# Patient Record
Sex: Male | Born: 1990 | Race: White | Hispanic: No | Marital: Married
Health system: Midwestern US, Community
[De-identification: ages and names within clinical notes are randomized; demographics above are authoritative.]

---

## 2014-11-17 ENCOUNTER — Inpatient Hospital Stay (HOSPITAL_BASED_OUTPATIENT_CLINIC_OR_DEPARTMENT_OTHER)
Admission: EM | Admit: 2014-11-17 | Discharge: 2014-11-18 | DRG: 343 | Disposition: A | Discharge status: Home / Self Care | Payer: BLUE CROSS/BLUE SHIELD | Attending: Surgery | Admitting: Surgery

## 2014-11-17 ENCOUNTER — Encounter (HOSPITAL_BASED_OUTPATIENT_CLINIC_OR_DEPARTMENT_OTHER): Payer: Self-pay | Admitting: *Deleted

## 2014-11-17 ENCOUNTER — Emergency Department (HOSPITAL_BASED_OUTPATIENT_CLINIC_OR_DEPARTMENT_OTHER): Payer: BLUE CROSS/BLUE SHIELD

## 2014-11-17 DIAGNOSIS — K3580 Unspecified acute appendicitis: Secondary | ICD-10-CM | POA: Diagnosis present

## 2014-11-17 DIAGNOSIS — R1031 Right lower quadrant pain: Secondary | ICD-10-CM | POA: Diagnosis present

## 2014-11-17 DIAGNOSIS — K37 Unspecified appendicitis: Secondary | ICD-10-CM | POA: Diagnosis present

## 2014-11-17 LAB — URINALYSIS, ROUTINE W REFLEX MICROSCOPIC
Bilirubin Urine: NEGATIVE
GLUCOSE, UA: NEGATIVE mg/dL
Hgb urine dipstick: NEGATIVE
KETONES UR: NEGATIVE mg/dL
LEUKOCYTES UA: NEGATIVE
NITRITE: NEGATIVE
PH: 6.5 (ref 5.0–8.0)
Protein, ur: NEGATIVE mg/dL
SPECIFIC GRAVITY, URINE: 1.025 (ref 1.005–1.030)
Urobilinogen, UA: 0.2 mg/dL (ref 0.0–1.0)

## 2014-11-17 LAB — CBC WITH DIFFERENTIAL/PLATELET
BASOS PCT: 0 %
Basophils Absolute: 0 10*3/uL (ref 0.0–0.1)
Eosinophils Absolute: 0.1 10*3/uL (ref 0.0–0.7)
Eosinophils Relative: 2 %
HEMATOCRIT: 41.4 % (ref 39.0–52.0)
HEMOGLOBIN: 13.9 g/dL (ref 13.0–17.0)
LYMPHS ABS: 2.1 10*3/uL (ref 0.7–4.0)
LYMPHS PCT: 30 %
MCH: 28.9 pg (ref 26.0–34.0)
MCHC: 33.6 g/dL (ref 30.0–36.0)
MCV: 86.1 fL (ref 78.0–100.0)
MONO ABS: 0.7 10*3/uL (ref 0.1–1.0)
MONOS PCT: 11 %
NEUTROS ABS: 4 10*3/uL (ref 1.7–7.7)
NEUTROS PCT: 57 %
Platelets: 231 10*3/uL (ref 150–400)
RBC: 4.81 MIL/uL (ref 4.22–5.81)
RDW: 13 % (ref 11.5–15.5)
WBC: 7 10*3/uL (ref 4.0–10.5)

## 2014-11-17 LAB — COMPREHENSIVE METABOLIC PANEL
ALBUMIN: 4.3 g/dL (ref 3.5–5.0)
ALK PHOS: 55 U/L (ref 38–126)
ALT: 17 U/L (ref 17–63)
ANION GAP: 7 (ref 5–15)
AST: 18 U/L (ref 15–41)
BILIRUBIN TOTAL: 0.5 mg/dL (ref 0.3–1.2)
BUN: 21 mg/dL — AB (ref 6–20)
CALCIUM: 9.2 mg/dL (ref 8.9–10.3)
CO2: 26 mmol/L (ref 22–32)
Chloride: 105 mmol/L (ref 101–111)
Creatinine, Ser: 1.07 mg/dL (ref 0.61–1.24)
GFR calc Af Amer: >60 mL/min (ref >60)
GLUCOSE: 93 mg/dL (ref 65–99)
POTASSIUM: 4.2 mmol/L (ref 3.5–5.1)
Sodium: 138 mmol/L (ref 135–145)
TOTAL PROTEIN: 7.3 g/dL (ref 6.5–8.1)

## 2014-11-17 LAB — LIPASE, BLOOD: LIPASE: 26 U/L (ref 11–51)

## 2014-11-17 MED ORDER — METRONIDAZOLE IN NACL 5-0.79 MG/ML-% IV SOLN
500.0000 mg | Freq: Three times a day (TID) | INTRAVENOUS | Status: DC
  Administered 2014-11-18 (×3): 500 mg via INTRAVENOUS
  Filled 2014-11-17 (×3): qty 100

## 2014-11-17 MED ORDER — DEXTROSE 5 % IV SOLN
2.0000 g | INTRAVENOUS | Status: DC
  Administered 2014-11-18: 2 g via INTRAVENOUS
  Filled 2014-11-17: qty 2

## 2014-11-17 MED ORDER — HYDROCODONE-ACETAMINOPHEN 5-325 MG PO TABS
1.0000 | ORAL_TABLET | ORAL | Status: DC | PRN
  Administered 2014-11-18 (×2): 1 via ORAL
  Filled 2014-11-17 (×2): qty 1

## 2014-11-17 MED ORDER — ONDANSETRON HCL 4 MG/2ML IJ SOLN
4.0000 mg | Freq: Once | INTRAMUSCULAR | Status: AC
  Administered 2014-11-17: 4 mg via INTRAVENOUS
  Filled 2014-11-17: qty 2

## 2014-11-17 MED ORDER — SODIUM CHLORIDE 0.9 % IV BOLUS (SEPSIS)
1000.0000 mL | Freq: Once | INTRAVENOUS | Status: AC
  Administered 2014-11-17: 1000 mL via INTRAVENOUS

## 2014-11-17 MED ORDER — HYDROMORPHONE HCL 1 MG/ML IJ SOLN: 0.2500 mg | INTRAMUSCULAR | Status: DC | PRN

## 2014-11-17 MED ORDER — MORPHINE SULFATE (PF) 4 MG/ML IV SOLN
4.0000 mg | INTRAVENOUS | Status: DC | PRN
  Administered 2014-11-17: 4 mg via INTRAVENOUS
  Filled 2014-11-17: qty 1

## 2014-11-17 MED ORDER — IOHEXOL 300 MG/ML  SOLN
100.0000 mL | Freq: Once | INTRAMUSCULAR | Status: AC | PRN
  Administered 2014-11-17: 100 mL via INTRAVENOUS

## 2014-11-17 MED ORDER — ONDANSETRON 4 MG PO TBDP: 4.0000 mg | ORAL_TABLET | Freq: Four times a day (QID) | ORAL | Status: DC | PRN

## 2014-11-17 MED ORDER — MORPHINE SULFATE (PF) 4 MG/ML IV SOLN
4.0000 mg | Freq: Once | INTRAVENOUS | Status: AC
  Administered 2014-11-17: 4 mg via INTRAVENOUS
  Filled 2014-11-17: qty 1

## 2014-11-17 MED ORDER — IOHEXOL 300 MG/ML  SOLN
25.0000 mL | Freq: Once | INTRAMUSCULAR | Status: AC | PRN
  Administered 2014-11-17: 25 mL via ORAL

## 2014-11-17 MED ORDER — ONDANSETRON HCL 4 MG/2ML IJ SOLN: 4.0000 mg | Freq: Four times a day (QID) | INTRAMUSCULAR | Status: DC | PRN

## 2014-11-17 MED ORDER — DEXTROSE-NACL 5-0.9 % IV SOLN
INTRAVENOUS | Status: DC
  Administered 2014-11-18: 01:00:00 via INTRAVENOUS
  Administered 2014-11-18: 100 mL/h via INTRAVENOUS

## 2014-11-17 NOTE — ED Provider Notes (Signed)
Mr. Pelzer is a 24 year old male with presumed appendicitis who was transferred here from [facility] ED earlier today for evaluation at [facility] by general surgeon Dr. Kieth Brightly.   Mr. Golden reports RLQ abdominal pain that has been increasing in severity since yesterday. Pain was initially located in lumbar region but is not settled in RLQ. Pain is associated with nausea and anorexia. Denies fevers, chills or vomiting. Pt reports he is feeling good after receiving morphine. He states his pain is down from an 8 to a 4.  On exam, point tenderness over McBurney's point. No rebound or guarding. Pt appears comfortable and non-toxic. Lungs CTAB. Heart RRR. CT abd/pelv states "Mildly prominent appendix with minimal periappendiceal inflammatory changes. Would be difficult to exclude very early appendicitis". Consulted general surgery. Dr. Kieth Brightly is familiar with patient's story and will be down to admit. For full HPI and exam details, see Monico Blitz, PA-C note from [facility] visit earlier today.    Filed Vitals:   11/17/14 1812 11/17/14 2027 11/17/14 2114 11/17/14 2207  BP: 129/86 134/91 118/74 127/81  Pulse: 67 63 68 62  Temp: 98.3 F (36.8 C) 98.4 F (36.9 C) 97.6 F (36.4 C) 97.7 F (36.5 C)  TempSrc: Oral Oral Oral Oral  Resp: _0 Height: _1  (1.854 m)     Weight: 195 lb (88.451 kg)     SpO2: 99% 98% 96% 96%   Results for orders placed or performed during the hospital encounter of 11/17/14 (from the past 48 hour(s))  Urinalysis, Routine w reflex microscopic (not at [facility])     Status: None   Collection Time: 11/17/14  6:10 PM  Result Value Ref Range   Color, Urine YELLOW YELLOW   APPearance CLEAR CLEAR   Specific Gravity, Urine 1.025 1.005 - 1.030   pH 6.5 5.0 - 8.0   Glucose, UA NEGATIVE NEGATIVE mg/dL   Hgb urine dipstick NEGATIVE NEGATIVE   Bilirubin Urine NEGATIVE NEGATIVE   Ketones, ur NEGATIVE NEGATIVE mg/dL   Protein, ur NEGATIVE NEGATIVE mg/dL   Urobilinogen,  UA 0.2 0.0 - 1.0 mg/dL   Nitrite NEGATIVE NEGATIVE   Leukocytes, UA NEGATIVE NEGATIVE    Comment: MICROSCOPIC NOT DONE ON URINES WITH NEGATIVE PROTEIN, BLOOD, LEUKOCYTES, NITRITE, OR GLUCOSE <1000 mg/dL.  CBC with Differential     Status: None   Collection Time: 11/17/14  6:35 PM  Result Value Ref Range   WBC 7.0 4.0 - 10.5 K/uL   RBC 4.81 4.22 - 5.81 MIL/uL   Hemoglobin 13.9 13.0 - 17.0 g/dL   HCT 41.4 39.0 - 52.0 %   MCV 86.1 78.0 - 100.0 fL   MCH 28.9 26.0 - 34.0 pg   MCHC 33.6 30.0 - 36.0 g/dL   RDW 13.0 11.5 - 15.5 %   Platelets 231 150 - 400 K/uL   Neutrophils Relative % 57 %   Neutro Abs 4.0 1.7 - 7.7 K/uL   Lymphocytes Relative 30 %   Lymphs Abs 2.1 0.7 - 4.0 K/uL   Monocytes Relative 11 %   Monocytes Absolute 0.7 0.1 - 1.0 K/uL   Eosinophils Relative 2 %   Eosinophils Absolute 0.1 0.0 - 0.7 K/uL   Basophils Relative 0 %   Basophils Absolute 0.0 0.0 - 0.1 K/uL  Comprehensive metabolic panel     Status: Abnormal   Collection Time: 11/17/14  6:35 PM  Result Value Ref Range   Sodium 138 135 - 145 mmol/L   Potassium 4.2  3.5 - 5.1 mmol/L   Chloride 105 101 - 111 mmol/L   CO2 26 22 - 32 mmol/L   Glucose, Bld 93 65 - 99 mg/dL   BUN 21 (H) 6 - 20 mg/dL   Creatinine, Ser 1.07 0.61 - 1.24 mg/dL   Calcium 9.2 8.9 - 10.3 mg/dL   Total Protein 7.3 6.5 - 8.1 g/dL   Albumin 4.3 3.5 - 5.0 g/dL   AST 18 15 - 41 U/L   ALT 17 17 - 63 U/L   Alkaline Phosphatase 55 38 - 126 U/L   Total Bilirubin 0.5 0.3 - 1.2 mg/dL   GFR calc non Af Amer >60 >60 mL/min   GFR calc Af Amer >60 >60 mL/min    Comment: (NOTE) The eGFR has been calculated using the CKD EPI equation. This calculation has not been validated in all clinical situations. eGFR's persistently <60 mL/min signify possible Chronic Kidney Disease.    Anion gap 7 5 - 15  Lipase, blood     Status: None   Collection Time: 11/17/14  6:35 PM  Result Value Ref Range   Lipase 26 11 - 51 U/L    Comment: Please note change in  reference range.      Josephina Gip, PA-C 11/18/14 0005  Serita Grit, MD 11/19/14 [phone]

## 2014-11-17 NOTE — ED Notes (Signed)
Right lower quadrant pain since yesterday

## 2014-11-17 NOTE — ED Notes (Signed)
Bed: [id number] Expected date:  Expected time:  Means of arrival:  Comments: Pt from [facility]

## 2014-11-17 NOTE — ED Notes (Signed)
Pt had allergic reaction with hives and itching with [facility] staff.  50 mg of Benadryl IV was given..Marland Kitchen

## 2014-11-17 NOTE — ED Notes (Signed)
Pt transferred to [facility] via [facility]

## 2014-11-17 NOTE — ED Provider Notes (Signed)
CSN: [id number]     Arrival date & time 11/17/14  1807 History   First MD Initiated Contact with Patient 11/17/14 1814     Chief Complaint  Patient presents with  . Abdominal Pain     (Consider location/radiation/quality/duration/timing/severity/associated sxs/prior Treatment) HPI  Blood pressure 129/86, pulse 67, temperature 98.3 F (36.8 C), temperature source Oral, resp. rate 18, height 6\' 1"  (1.854 m), weight 195 lb (88.451 kg), SpO2 99 %.  Brandon Houston is a 24 y.o. male complaining of complaining of worsening right lower quadrant pain onset yesterday. Patient states that initially the pain was in the low back, it's worsened to 8 out of 10 and is associated with nausea and decreased appetite. No vomiting, fever, dysuria, hematuria. On review of systems patient endorses mild constipation. Last oral intake was poor T a chips 3 hours ago.  History reviewed. No pertinent past medical history. History reviewed. No pertinent past surgical history. No family history on file. Social History  Substance Use Topics  . Smoking status: Never Smoker   . Smokeless tobacco: None  . Alcohol Use: No    Review of Systems  10 systems reviewed and found to be negative, except as noted in the HPI.   Allergies  Review of patient's allergies indicates no known allergies.  Home Medications   Prior to Admission medications   Not on File   BP 129/86 mmHg  Pulse 67  Temp(Src) 98.3 F (36.8 C) (Oral)  Resp 18  Ht 6\' 1"  (1.854 m)  Wt 195 lb (88.451 kg)  BMI 25.73 kg/m2  SpO2 99% Physical Exam  Constitutional: He is oriented to person, place, and time. He appears well-developed and well-nourished. No distress.  HENT:  Head: Normocephalic.  Eyes: Conjunctivae and EOM are normal.  Cardiovascular: Normal rate and regular rhythm.   Pulmonary/Chest: Effort normal. No stridor.  Abdominal: Soft. Bowel sounds are normal. He exhibits no distension and no mass. There is tenderness. There is no  rebound and no guarding.  Murphy sign negative,   + TTP over McBurney's point, + Rovsings, +Psoas, + obturator.   Musculoskeletal: Normal range of motion.  Neurological: He is alert and oriented to person, place, and time.  Psychiatric: He has a normal mood and affect.  Nursing note and vitals reviewed.   ED Course  Procedures (including critical care time) Labs Review Labs Reviewed  COMPREHENSIVE METABOLIC PANEL - Abnormal; Notable for the following:    BUN 21 (*)    All other components within normal limits  URINALYSIS, ROUTINE W REFLEX MICROSCOPIC (NOT AT [facility])  CBC WITH DIFFERENTIAL/PLATELET  LIPASE, BLOOD    Imaging Review No results found. I have personally reviewed and evaluated these images and lab results as part of my medical decision-making.   EKG Interpretation None      MDM   Final diagnoses:  RLQ abdominal pain    Filed Vitals:   11/17/14 1812  BP: 129/86  Pulse: 67  Temp: 98.3 F (36.8 C)  TempSrc: Oral  Resp: 18  Height: 6\' 1"  (1.854 m)  Weight: 195 lb (88.451 kg)  SpO2: 99%    Medications  morphine 4 MG/ML injection 4 mg (not administered)  sodium chloride 0.9 % bolus 1,000 mL (not administered)    Brandon Houston is 24 y.o. male presenting with right lower quadrant pain onset this morning, patient is nauseous, reduced by mouth intake. Physical exam with positive tenderness palpation over McBurney's point, positive Rovsing psoas and obturator. No urinary symptoms. Patient  advised to remain nothing by mouth.   Blood work and urine unremarkable.  CT abdomen pelvis equivocal, I think this is likely an early appendicitis. Case discussed with general surgeon Dr. Sheliah Hatch  who will evaluate him at [facility], case discussed with ED physician Dr. Loretha Stapler who accepts transfer.        [name], PA-C 11/17/14 2121  Geoffery Lyons, MD 11/17/14 2244

## 2014-11-17 NOTE — H&P (Signed)
Brandon Houston is an 24 y.o. male.   Chief Complaint: abdominal pain HPI: 24yo male with 2 days of RLQ abdominal pain. Pain first located in right back then moved to RLQ. He has been nauseated all day, pain does not radiate, the patient notes "rebound". He has had no diarrhea and has no history of constipation. He has had no previous similar episodes.  History reviewed. No pertinent past medical history.  History reviewed. No pertinent past surgical history.  No family history on file. Social History:  reports that he has never smoked. He does not have any smokeless tobacco history on file. He reports that he does not drink alcohol or use illicit drugs.  Allergies:  Allergies  Allergen Reactions  . Morphine And Related Anaphylaxis and Hives     (Not in a hospital admission)  Results for orders placed or performed during the hospital encounter of 11/17/14 (from the past 48 hour(s))  Urinalysis, Routine w reflex microscopic (not at [facility])     Status: None   Collection Time: 11/17/14  6:10 PM  Result Value Ref Range   Color, Urine YELLOW YELLOW   APPearance CLEAR CLEAR   Specific Gravity, Urine 1.025 1.005 - 1.030   pH 6.5 5.0 - 8.0   Glucose, UA NEGATIVE NEGATIVE mg/dL   Hgb urine dipstick NEGATIVE NEGATIVE   Bilirubin Urine NEGATIVE NEGATIVE   Ketones, ur NEGATIVE NEGATIVE mg/dL   Protein, ur NEGATIVE NEGATIVE mg/dL   Urobilinogen, UA 0.2 0.0 - 1.0 mg/dL   Nitrite NEGATIVE NEGATIVE   Leukocytes, UA NEGATIVE NEGATIVE    Comment: MICROSCOPIC NOT DONE ON URINES WITH NEGATIVE PROTEIN, BLOOD, LEUKOCYTES, NITRITE, OR GLUCOSE <1000 mg/dL.  CBC with Differential     Status: None   Collection Time: 11/17/14  6:35 PM  Result Value Ref Range   WBC 7.0 4.0 - 10.5 K/uL   RBC 4.81 4.22 - 5.81 MIL/uL   Hemoglobin 13.9 13.0 - 17.0 g/dL   HCT 41.4 39.0 - 52.0 %   MCV 86.1 78.0 - 100.0 fL   MCH 28.9 26.0 - 34.0 pg   MCHC 33.6 30.0 - 36.0 g/dL   RDW 13.0 11.5 - 15.5 %   Platelets 231 150 -  400 K/uL   Neutrophils Relative % 57 %   Neutro Abs 4.0 1.7 - 7.7 K/uL   Lymphocytes Relative 30 %   Lymphs Abs 2.1 0.7 - 4.0 K/uL   Monocytes Relative 11 %   Monocytes Absolute 0.7 0.1 - 1.0 K/uL   Eosinophils Relative 2 %   Eosinophils Absolute 0.1 0.0 - 0.7 K/uL   Basophils Relative 0 %   Basophils Absolute 0.0 0.0 - 0.1 K/uL  Comprehensive metabolic panel     Status: Abnormal   Collection Time: 11/17/14  6:35 PM  Result Value Ref Range   Sodium 138 135 - 145 mmol/L   Potassium 4.2 3.5 - 5.1 mmol/L   Chloride 105 101 - 111 mmol/L   CO2 26 22 - 32 mmol/L   Glucose, Bld 93 65 - 99 mg/dL   BUN 21 (H) 6 - 20 mg/dL   Creatinine, Ser 1.07 0.61 - 1.24 mg/dL   Calcium 9.2 8.9 - 10.3 mg/dL   Total Protein 7.3 6.5 - 8.1 g/dL   Albumin 4.3 3.5 - 5.0 g/dL   AST 18 15 - 41 U/L   ALT 17 17 - 63 U/L   Alkaline Phosphatase 55 38 - 126 U/L   Total Bilirubin 0.5 0.3 - 1.2  mg/dL   GFR calc non Af Amer >60 >60 mL/min   GFR calc Af Amer >60 >60 mL/min    Comment: (NOTE) The eGFR has been calculated using the CKD EPI equation. This calculation has not been validated in all clinical situations. eGFR's persistently <60 mL/min signify possible Chronic Kidney Disease.    Anion gap 7 5 - 15  Lipase, blood     Status: None   Collection Time: 11/17/14  6:35 PM  Result Value Ref Range   Lipase 26 11 - 51 U/L    Comment: Please note change in reference range.   Ct Abdomen Pelvis W Contrast  11/17/2014  CLINICAL DATA:  Right lower quadrant pain for 5 days, initial encounter EXAM: CT ABDOMEN AND PELVIS WITH CONTRAST TECHNIQUE: Multidetector CT imaging of the abdomen and pelvis was performed using the standard protocol following bolus administration of intravenous contrast. CONTRAST:  62m OMNIPAQUE IOHEXOL 300 MG/ML SOLN, 1082mOMNIPAQUE IOHEXOL 300 MG/ML SOLN COMPARISON:  None. FINDINGS: Lung bases are free of acute infiltrate or sizable effusion. No parenchymal nodules are seen. Very minimal basilar  atelectasis is noted. The liver is diffusely decreased in attenuation consistent with fatty infiltration. The gallbladder, spleen, adrenal glands and pancreas are all normal in their CT appearance. Kidneys are well visualized bilaterally without renal calculi or urinary tract obstructive changes. The abdominal aorta and IVC are within normal limits. The bladder is well distended. No pelvic mass lesion or sidewall abnormality is seen. The appendix is well visualized and measures 9.2 mm in greatest dimension. Some very minimal periappendiceal inflammatory changes are noted. A few through it filled loops of small bowel are noted in the right mid abdomen although no true obstructive changes are seen. This may be related some localized enteritis. No bony abnormality is seen. IMPRESSION: Mildly prominent appendix with minimal periappendiceal inflammatory changes. Would be difficult to exclude very early appendicitis although the patient's white blood cell count is normal. Few loops of fluid filled mildly prominent small bowel in the right mid abdomen without true obstructive change. This may represent some mild enteritis. Clinical correlation with physical exam is recommended. Electronically Signed   By: MaInez Catalina.D.   On: 11/17/2014 20:30    Review of Systems  Constitutional: Negative for fever and chills.  Eyes: Negative for blurred vision and double vision.  Respiratory: Negative for cough and hemoptysis.   Cardiovascular: Negative for chest pain and palpitations.  Gastrointestinal: Positive for nausea, vomiting and abdominal pain. Negative for diarrhea and constipation.  Genitourinary: Negative for dysuria and urgency.  Musculoskeletal: Positive for back pain. Negative for myalgias and neck pain.  Skin: Negative for itching and rash.  Neurological: Negative for dizziness, tingling and headaches.  Endo/Heme/Allergies: Negative for environmental allergies. Does not bruise/bleed easily.   Psychiatric/Behavioral: Negative for depression and substance abuse.    Blood pressure 127/81, pulse 62, temperature 97.7 F (36.5 C), temperature source Oral, resp. rate 18, height _0  (1.854 m), weight 88.451 kg (195 lb), SpO2 96 %. Physical Exam  Constitutional: He is oriented to person, place, and time. He appears well-developed and well-nourished.  HENT:  Head: Normocephalic and atraumatic.  Eyes: Conjunctivae are normal. Pupils are equal, round, and reactive to light.  Neck: Normal range of motion. Neck supple.  Cardiovascular: Normal rate and regular rhythm.   Respiratory: Effort normal and breath sounds normal.  GI: Soft. There is tenderness in the right lower quadrant. There is tenderness at McBurney's point.  +iliopsoas sign  Musculoskeletal:  Normal range of motion. He exhibits no edema.  Neurological: He is alert and oriented to person, place, and time.  Skin: Skin is warm and dry.  Psychiatric: He has a normal mood and affect. His behavior is normal.     Assessment/Plan 24 yo male with exam concerning for appendicits. Nml WBC. CT scan with borderline appendix width. -admit for Abx -if no improvement tomorrow plan for appendectomy -NPO -IVF  Arta Bruce Armonii Sieh 11/17/2014, 11:48 PM

## 2014-11-17 NOTE — ED Notes (Signed)
Pt in ct 

## 2014-11-17 NOTE — ED Notes (Signed)
Pt has completed oral contrast. CT notified.

## 2014-11-18 ENCOUNTER — Encounter (HOSPITAL_COMMUNITY): Admission: EM | Disposition: A | Discharge status: Home / Self Care | Payer: Self-pay | Source: Home / Self Care

## 2014-11-18 ENCOUNTER — Inpatient Hospital Stay (HOSPITAL_COMMUNITY): Payer: BLUE CROSS/BLUE SHIELD | Admitting: Certified Registered Nurse Anesthetist

## 2014-11-18 ENCOUNTER — Encounter: Payer: Self-pay | Admitting: General Surgery

## 2014-11-18 ENCOUNTER — Encounter (HOSPITAL_COMMUNITY): Payer: Self-pay | Admitting: Surgery

## 2014-11-18 HISTORY — PX: LAPAROSCOPIC APPENDECTOMY: SHX408

## 2014-11-18 LAB — SURGICAL PCR SCREEN
MRSA, PCR: NEGATIVE
STAPHYLOCOCCUS AUREUS: NEGATIVE

## 2014-11-18 LAB — CBC
HCT: 42.9 % (ref 39.0–52.0)
HEMOGLOBIN: 13.9 g/dL (ref 13.0–17.0)
MCH: 28.9 pg (ref 26.0–34.0)
MCHC: 32.4 g/dL (ref 30.0–36.0)
MCV: 89.2 fL (ref 78.0–100.0)
PLATELETS: 205 10*3/uL (ref 150–400)
RBC: 4.81 MIL/uL (ref 4.22–5.81)
RDW: 13.4 % (ref 11.5–15.5)
WBC: 5.8 10*3/uL (ref 4.0–10.5)

## 2014-11-18 SURGERY — APPENDECTOMY, LAPAROSCOPIC
Anesthesia: General

## 2014-11-18 MED ORDER — FENTANYL CITRATE (PF) 100 MCG/2ML IJ SOLN: 25.0000 ug | INTRAMUSCULAR | Status: DC | PRN

## 2014-11-18 MED ORDER — METOCLOPRAMIDE HCL 5 MG/ML IJ SOLN
INTRAMUSCULAR | Status: AC
  Filled 2014-11-18: qty 2

## 2014-11-18 MED ORDER — PROPOFOL 10 MG/ML IV BOLUS
INTRAVENOUS | Status: AC
  Filled 2014-11-18: qty 20

## 2014-11-18 MED ORDER — NEOSTIGMINE METHYLSULFATE 10 MG/10ML IV SOLN
INTRAVENOUS | Status: DC | PRN
  Administered 2014-11-18: 5 mg via INTRAVENOUS

## 2014-11-18 MED ORDER — PROMETHAZINE HCL 25 MG/ML IJ SOLN
6.2500 mg | INTRAMUSCULAR | Status: DC | PRN
  Administered 2014-11-18: 6.25 mg via INTRAVENOUS

## 2014-11-18 MED ORDER — BUPIVACAINE-EPINEPHRINE 0.5% -1:200000 IJ SOLN
INTRAMUSCULAR | Status: DC | PRN
  Administered 2014-11-18: 30 mL

## 2014-11-18 MED ORDER — LIDOCAINE HCL (CARDIAC) 20 MG/ML IV SOLN
INTRAVENOUS | Status: AC
  Filled 2014-11-18: qty 5

## 2014-11-18 MED ORDER — ONDANSETRON HCL 4 MG/2ML IJ SOLN
INTRAMUSCULAR | Status: DC | PRN
  Administered 2014-11-18: 4 mg via INTRAVENOUS

## 2014-11-18 MED ORDER — HYDROMORPHONE HCL 1 MG/ML IJ SOLN: 0.2500 mg | INTRAMUSCULAR | Status: DC | PRN

## 2014-11-18 MED ORDER — FENTANYL CITRATE (PF) 250 MCG/5ML IJ SOLN
INTRAMUSCULAR | Status: AC
  Filled 2014-11-18: qty 25

## 2014-11-18 MED ORDER — HYDROCODONE-ACETAMINOPHEN 5-325 MG PO TABS: 1.0000 | ORAL_TABLET | ORAL | Status: AC | PRN

## 2014-11-18 MED ORDER — DEXAMETHASONE SODIUM PHOSPHATE 10 MG/ML IJ SOLN
INTRAMUSCULAR | Status: AC
  Filled 2014-11-18: qty 1

## 2014-11-18 MED ORDER — SUCCINYLCHOLINE CHLORIDE 20 MG/ML IJ SOLN
INTRAMUSCULAR | Status: DC | PRN
  Administered 2014-11-18: 100 mg via INTRAVENOUS

## 2014-11-18 MED ORDER — FENTANYL CITRATE (PF) 100 MCG/2ML IJ SOLN
INTRAMUSCULAR | Status: DC | PRN
  Administered 2014-11-18 (×5): 50 ug via INTRAVENOUS

## 2014-11-18 MED ORDER — KETOROLAC TROMETHAMINE 30 MG/ML IJ SOLN: 30.0000 mg | Freq: Four times a day (QID) | INTRAMUSCULAR | Status: DC | PRN

## 2014-11-18 MED ORDER — DEXAMETHASONE SODIUM PHOSPHATE 10 MG/ML IJ SOLN
INTRAMUSCULAR | Status: DC | PRN
  Administered 2014-11-18: 10 mg via INTRAVENOUS

## 2014-11-18 MED ORDER — PROPOFOL 10 MG/ML IV BOLUS
INTRAVENOUS | Status: DC | PRN
  Administered 2014-11-18: 170 mg via INTRAVENOUS

## 2014-11-18 MED ORDER — GLYCOPYRROLATE 0.2 MG/ML IJ SOLN
INTRAMUSCULAR | Status: DC | PRN
  Administered 2014-11-18: 0.6 mg via INTRAVENOUS

## 2014-11-18 MED ORDER — GLYCOPYRROLATE 0.2 MG/ML IJ SOLN
INTRAMUSCULAR | Status: AC
  Filled 2014-11-18: qty 3

## 2014-11-18 MED ORDER — BUPIVACAINE-EPINEPHRINE (PF) 0.25% -1:200000 IJ SOLN
INTRAMUSCULAR | Status: AC
  Filled 2014-11-18: qty 30

## 2014-11-18 MED ORDER — METOCLOPRAMIDE HCL 5 MG/ML IJ SOLN
INTRAMUSCULAR | Status: DC | PRN
  Administered 2014-11-18: 10 mg via INTRAVENOUS

## 2014-11-18 MED ORDER — IBUPROFEN 200 MG PO TABS: ORAL_TABLET | ORAL | Status: AC

## 2014-11-18 MED ORDER — MEPERIDINE HCL 50 MG/ML IJ SOLN: 6.2500 mg | INTRAMUSCULAR | Status: DC | PRN

## 2014-11-18 MED ORDER — MIDAZOLAM HCL 5 MG/5ML IJ SOLN
INTRAMUSCULAR | Status: DC | PRN
  Administered 2014-11-18: 2 mg via INTRAVENOUS

## 2014-11-18 MED ORDER — MIDAZOLAM HCL 2 MG/2ML IJ SOLN
INTRAMUSCULAR | Status: AC
  Filled 2014-11-18: qty 4

## 2014-11-18 MED ORDER — ROCURONIUM BROMIDE 100 MG/10ML IV SOLN
INTRAVENOUS | Status: AC
  Filled 2014-11-18: qty 1

## 2014-11-18 MED ORDER — METRONIDAZOLE IN NACL 5-0.79 MG/ML-% IV SOLN
INTRAVENOUS | Status: AC
  Filled 2014-11-18: qty 100

## 2014-11-18 MED ORDER — ONDANSETRON HCL 4 MG/2ML IJ SOLN
INTRAMUSCULAR | Status: AC
  Filled 2014-11-18: qty 2

## 2014-11-18 MED ORDER — GLYCOPYRROLATE 0.2 MG/ML IJ SOLN
INTRAMUSCULAR | Status: AC
  Filled 2014-11-18: qty 1

## 2014-11-18 MED ORDER — LIDOCAINE HCL (CARDIAC) 20 MG/ML IV SOLN
INTRAVENOUS | Status: DC | PRN
  Administered 2014-11-18: 100 mg via INTRAVENOUS

## 2014-11-18 MED ORDER — PROMETHAZINE HCL 25 MG/ML IJ SOLN
INTRAMUSCULAR | Status: AC
  Filled 2014-11-18: qty 1

## 2014-11-18 MED ORDER — NEOSTIGMINE METHYLSULFATE 10 MG/10ML IV SOLN
INTRAVENOUS | Status: AC
  Filled 2014-11-18: qty 1

## 2014-11-18 MED ORDER — ROCURONIUM BROMIDE 100 MG/10ML IV SOLN
INTRAVENOUS | Status: DC | PRN
  Administered 2014-11-18: 30 mg via INTRAVENOUS

## 2014-11-18 MED ORDER — BUPIVACAINE-EPINEPHRINE 0.5% -1:200000 IJ SOLN
INTRAMUSCULAR | Status: AC
  Filled 2014-11-18: qty 1

## 2014-11-18 MED ORDER — FENTANYL CITRATE (PF) 100 MCG/2ML IJ SOLN
INTRAMUSCULAR | Status: AC
  Administered 2014-11-18: 50 ug
  Filled 2014-11-18: qty 2

## 2014-11-18 MED ORDER — LACTATED RINGERS IV SOLN
INTRAVENOUS | Status: DC | PRN
  Administered 2014-11-18: 08:00:00 via INTRAVENOUS

## 2014-11-18 SURGICAL SUPPLY — 35 items
APPLIER CLIP ROT 10 11.4 M/L (STAPLE)
BENZOIN TINCTURE PRP APPL 2/3 (GAUZE/BANDAGES/DRESSINGS) IMPLANT
CABLE HIGH FREQUENCY MONO STRZ (ELECTRODE) ×2 IMPLANT
CHLORAPREP W/TINT 26ML (MISCELLANEOUS) ×2 IMPLANT
CLIP APPLIE ROT 10 11.4 M/L (STAPLE) IMPLANT
COVER SURGICAL LIGHT HANDLE (MISCELLANEOUS) ×2 IMPLANT
CUTTER FLEX LINEAR 45M (STAPLE) ×2 IMPLANT
DECANTER SPIKE VIAL GLASS SM (MISCELLANEOUS) ×2 IMPLANT
DRAPE LAPAROSCOPIC ABDOMINAL (DRAPES) ×2 IMPLANT
ELECT REM PT RETURN 9FT ADLT (ELECTROSURGICAL) ×2
ELECTRODE REM PT RTRN 9FT ADLT (ELECTROSURGICAL) ×1 IMPLANT
ENDOLOOP SUT PDS II  0 18 (SUTURE)
ENDOLOOP SUT PDS II 0 18 (SUTURE) IMPLANT
GLOVE SURG SIGNA 7.5 PF LTX (GLOVE) ×2 IMPLANT
GOWN STRL REUS W/TWL XL LVL3 (GOWN DISPOSABLE) ×4 IMPLANT
KIT BASIN OR (CUSTOM PROCEDURE TRAY) ×2 IMPLANT
LIQUID BAND (GAUZE/BANDAGES/DRESSINGS) ×2 IMPLANT
POUCH SPECIMEN RETRIEVAL 10MM (ENDOMECHANICALS) ×2 IMPLANT
RELOAD 45 VASCULAR/THIN (ENDOMECHANICALS) IMPLANT
RELOAD STAPLE TA45 3.5 REG BLU (ENDOMECHANICALS) ×2 IMPLANT
SCISSORS LAP 5X35 DISP (ENDOMECHANICALS) ×4 IMPLANT
SET IRRIG TUBING LAPAROSCOPIC (IRRIGATION / IRRIGATOR) ×2 IMPLANT
SHEARS HARMONIC ACE PLUS 36CM (ENDOMECHANICALS) ×2 IMPLANT
SLEEVE XCEL OPT CAN 5 100 (ENDOMECHANICALS) ×2 IMPLANT
STRIP CLOSURE SKIN 1/2X4 (GAUZE/BANDAGES/DRESSINGS) IMPLANT
SUT MNCRL AB 4-0 PS2 18 (SUTURE) ×2 IMPLANT
SUT VIC AB 2-0 SH 18 (SUTURE) IMPLANT
SUT VIC AB 2-0 SH 27 (SUTURE) ×1
SUT VIC AB 2-0 SH 27X BRD (SUTURE) ×1 IMPLANT
TOWEL OR 17X26 10 PK STRL BLUE (TOWEL DISPOSABLE) ×2 IMPLANT
TOWEL OR NON WOVEN STRL DISP B (DISPOSABLE) ×2 IMPLANT
TRAY FOLEY W/METER SILVER 14FR (SET/KITS/TRAYS/PACK) ×2 IMPLANT
TRAY LAPAROSCOPIC (CUSTOM PROCEDURE TRAY) ×2 IMPLANT
TROCAR BLADELESS OPT 5 100 (ENDOMECHANICALS) ×2 IMPLANT
TROCAR XCEL BLUNT TIP 100MML (ENDOMECHANICALS) ×2 IMPLANT

## 2014-11-18 NOTE — Progress Notes (Signed)
Patient's history and PE reviewed. Though the story is not straight forward, I think that he has appendicitis.  I discussed with the patient the indications and risks of appendiceal surgery.  The primary risks of appendiceal surgery include, but are not limited to, bleeding, infection, bowel surgery, and open surgery.  There is also the risk that the patient may have continued symptoms after surgery.  We discussed the typical post-operative recovery course. I tried to answer the patient's questions.  His wife, Leandro ReasonerRoxanne, is at the bedside.  He works at [employer].  Ovidio Kinavid Teonna Coonan, MD, [facility] Pager: [phone] Office phone:  [phone]

## 2014-11-18 NOTE — Op Note (Signed)
Re:   Brandon Houston DOB:   1990/02/13 MRN:   [id number]                   FACILITY:  [facility]  DATE OF PROCEDURE: 11/18/2014                              OPERATIVE REPORT  PREOPERATIVE DIAGNOSIS:  Appendicitis  POSTOPERATIVE DIAGNOSIS:  Early appendicitis.  PROCEDURE:  Laparoscopic appendectomy.  SURGEON:  Sandria Bales. Ezzard Standing, MD  ASSISTANT:  No first assistant.  ANESTHESIA:  General endotracheal.  Anesthesiologist: Sebastian Ache, MD CRNA: Orest Dikes, CRNA  ASA:  1E  ESTIMATED BLOOD LOSS:  Minimal.  DRAINS: none   SPECIMEN:   Appendix  COUNTS CORRECT:  YES  INDICATIONS FOR PROCEDURE: Brandon Houston is a 24 y.o. (DOB: 1990-12-20) white  male whose primary care doctor is [facility], MD and comes to the OR for an appendectomy.    His presentation is a little unusual - but he has an abnormal CT scan and persistent symptoms - so will go ahead with appendectomy.  I discussed with the patient, the indications and potential complications of appendiceal surgery.  The potential complications include, but are not limited to, bleeding, open surgery, bowel resection, and the possibility of another diagnosis.  OPERATIVE NOTE:  The patient underwent a general endotracheal anesthetic as supervised by Anesthesiologist: Sebastian Ache, MD CRNA: Orest Dikes, CRNA, General, in room #6.  The patient was on Rocephin and Flagyl prior to the beginning of the procedure and the abdomen was prepped with ChloraPrep.    A time-out was held and surgical checklist run.  An infraumbilical incision was made with sharp dissection carried down to the abdominal cavity.  An 12 mm Hasson trocar was inserted through the infraumbilical incision and into the peritoneal cavity.  A 30 degree 5 mm laparoscope was inserted through a 12 mm Hasson trocar and the Hasson trocar secured with a 0 Vicryl suture.  I placed a 5 mm trocar in the right upper quadrant and 5 mm torcar in left lower quadrant and did abdominal  exploration.    The right and left lobes of liver unremarkable.  Gall bladder was unremarkable.  Stomach was unremarkable.  The pelvic organs were unremarkable.  I saw no other intra-abdominal abnormality.  The patient had a turgid appendix consistent with early appendicitis with the appendix located right pelvic brim.  The mesentery of the appendix was divided with a Harmonic scalpel.  I got to the base of the appendix.  I then used a blue load 45 mm Ethicon Endo-GIA stapler and fired this across the base of the appendix.  I placed the appendix in EndoCatch bag and delivered the bag through the umbilical incision.  I irrigated the abdomen with 600 cc of saline.  There was bleeder at the lateral edge of the staple line and I did a  2-0 Vicryl suture to control this.  After irrigating the abdomen, I then removed the trocars, in turn.  The umbilical port fascia was closed with 0 Vicryl suture.   I closed the skin each site with a 4-0 Monocryl suture and painted the wounds with Dermabond.  I then injected a total of 30 mL of 0.25% Marcaine at the incisions.  Sponge and needle count were correct at the end of the case.   The patient was transferred to the recovery room in good condition.  The  patient tolerated the procedure well and it depends on the patient's post op clinical course as to when the patient could be discharged.   Brandon Kinavid Alonso Gapinski, MD, [facility] Pager: [phone] Office phone:  [phone]

## 2014-11-18 NOTE — Anesthesia Procedure Notes (Signed)
Procedure Name: Intubation Date/Time: 11/18/2014 8:10 AM Performed by: Orest DikesPETERS, Antaeus Karel J Pre-anesthesia Checklist: Patient identified, Emergency Drugs available, Suction available and Patient being monitored Patient Re-evaluated:Patient Re-evaluated prior to inductionOxygen Delivery Method: Circle System Utilized Preoxygenation: Pre-oxygenation with 100% oxygen Intubation Type: IV induction, Rapid sequence and Cricoid Pressure applied Laryngoscope Size: Mac and 4 Grade View: Grade I Tube type: Oral Tube size: 7.5 mm Number of attempts: 1 Airway Equipment and Method: Stylet Placement Confirmation: ETT inserted through vocal cords under direct vision,  positive ETCO2 and breath sounds checked- equal and bilateral Secured at: 21 cm Tube secured with: Tape Dental Injury: Teeth and Oropharynx as per pre-operative assessment

## 2014-11-18 NOTE — Discharge Instructions (Signed)
Laparoscopic Appendectomy, Adult, Care After °Refer to this sheet in the next few weeks. These instructions provide you with information on caring for yourself after your procedure. Your caregiver may also give you more specific instructions. Your treatment has been planned according to current medical practices, but problems sometimes occur. Call your caregiver if you have any problems or questions after your procedure. °HOME CARE INSTRUCTIONS °· Do not drive while taking narcotic pain medicines. °· Use stool softener if you become constipated from your pain medicines. °· Change your bandages (dressings) as directed. °· Keep your wounds clean and dry. You may wash the wounds gently with soap and water. Gently pat the wounds dry with a clean towel. °· Do not take baths, swim, or use hot tubs for 10 days, or as instructed by your caregiver. °· Only take over-the-counter or prescription medicines for pain, discomfort, or fever as directed by your caregiver. °· You may continue your normal diet as directed. °· Do not lift more than 10 pounds (4.5 kg) or play contact sports for 3 weeks, or as directed. °· Slowly increase your activity after surgery. °· Take deep breaths to avoid getting a lung infection (pneumonia). °SEEK MEDICAL CARE IF: °· You have redness, swelling, or increasing pain in your wounds. °· You have pus coming from your wounds. °· You have drainage from a wound that lasts longer than 1 day. °· You notice a bad smell coming from the wounds or dressing. °· Your wound edges break open after stitches (sutures) have been removed. °· You notice increasing pain in the shoulders (shoulder strap areas) or near your shoulder blades. °· You develop dizzy episodes or fainting while standing. °· You develop shortness of breath. °· You develop persistent nausea or vomiting. °· You cannot control your bowel functions or lose your appetite. °· You develop diarrhea. °SEEK IMMEDIATE MEDICAL CARE IF:  °· You have a  fever. °· You develop a rash. °· You have difficulty breathing or sharp pains in your chest. °· You develop any reaction or side effects to medicines given. °MAKE SURE YOU: °· Understand these instructions. °· Will watch your condition. °· Will get help right away if you are not doing well or get worse. °  °This information is not intended to replace advice given to you by your health care provider. Make sure you discuss any questions you have with your health care provider. °  °Document Released: 01/14/2005 Document Revised: 05/31/2014 Document Reviewed: 07/04/2014 °Elsevier Interactive Patient Education ©2016 [employer] ° °CCS ______[facility] °LAPAROSCOPIC SURGERY: POST OP INSTRUCTIONS °Always review your discharge instruction sheet given to you by the facility where your surgery was performed. °IF YOU HAVE DISABILITY OR FAMILY LEAVE FORMS, YOU MUST BRING THEM TO THE OFFICE FOR PROCESSING.   °DO NOT GIVE THEM TO YOUR DOCTOR. ° °1. A prescription for pain medication may be given to you upon discharge.  Take your pain medication as prescribed, if needed.  If narcotic pain medicine is not needed, then you may take acetaminophen (Tylenol) or ibuprofen (Advil) as needed. °2. Take your usually prescribed medications unless otherwise directed. °3. If you need a refill on your pain medication, please contact your pharmacy.  They will contact our office to request authorization. Prescriptions will not be filled after 5pm or on week-ends. °4. You should follow a light diet the first few days after arrival home, such as soup and crackers, etc.  Be sure to include lots of fluids daily. °5. Most   patients will experience some swelling and bruising in the area of the incisions.  Ice packs will help.  Swelling and bruising can take several days to resolve.  °6. It is common to experience some constipation if taking pain medication after surgery.  Increasing fluid intake and taking a stool softener (such as  Colace) will usually help or prevent this problem from occurring.  A mild laxative (Milk of Magnesia or Miralax) should be taken according to package instructions if there are no bowel movements after 48 hours. °7. Unless discharge instructions indicate otherwise, you may remove your bandages 24-48 hours after surgery, and you may shower at that time.  You may have steri-strips (small skin tapes) in place directly over the incision.  These strips should be left on the skin for 7-10 days.  If your surgeon used skin glue on the incision, you may shower in 24 hours.  The glue will flake off over the next 2-3 weeks.  Any sutures or staples will be removed at the office during your follow-up visit. °8. ACTIVITIES:  You may resume regular (light) daily activities beginning the next day--such as daily self-care, walking, climbing stairs--gradually increasing activities as tolerated.  You may have sexual intercourse when it is comfortable.  Refrain from any heavy lifting or straining until approved by your doctor. °a. You may drive when you are no longer taking prescription pain medication, you can comfortably wear a seatbelt, and you can safely maneuver your car and apply brakes. °b. RETURN TO WORK:  __________________________________________________________ °9. You should see your doctor in the office for a follow-up appointment approximately 2-3 weeks after your surgery.  Make sure that you call for this appointment within a day or two after you arrive home to insure a convenient appointment time. °10. OTHER INSTRUCTIONS: __________________________________________________________________________________________________________________________ __________________________________________________________________________________________________________________________ °WHEN TO CALL YOUR DOCTOR: °1. Fever over 101.0 °2. Inability to urinate °3. Continued bleeding from incision. °4. Increased pain, redness, or drainage from the  incision. °5. Increasing abdominal pain ° °The clinic staff is available to answer your questions during regular business hours.  Please don’t hesitate to call and ask to speak to one of the nurses for clinical concerns.  If you have a medical emergency, go to the nearest emergency room or call 911.  A surgeon from [facility] is always on call at the hospital. °[address], [address], [place], [place]  [address] ? [address] [address], [place], [place]   [address] °[phone] ? [phone] ? FAX [phone] °Web site: [email or web address] ° °

## 2014-11-18 NOTE — Discharge Summary (Signed)
Physician Discharge Summary  Patient ID: Brandon Houston MRN: [id number] DOB/AGE: 09-19-1990 24 y.o.  Admit date: 11/17/2014 Discharge date: 11/18/2014  Admission Diagnoses: Acute appendicitis  Discharge Diagnoses:  Same  Active Problems:   Appendicitis   PROCEDURES: Laparoscopic appendectomy, 11/18/14, Dr. Marty HeckNewman  Hospital Course:  24yo male with 2 days of RLQ abdominal pain. Pain first located in right back then moved to RLQ. He has been nauseated all day, pain does not radiate, the patient notes "rebound". He has had no diarrhea and has no history of constipation. He has had no previous similar episodes.  He was seen and admitted by Dr. Sheliah HatchKinsinger.    He was still tender the following AM and was taken to the OR by Dr. Ezzard StandingNewman.  He did well, tolerating diet and PO's by the PM and discharged home.    Condition on D/C:  Improved   Disposition: Home     Medication List    TAKE these medications        HYDROcodone-acetaminophen 5-325 MG tablet  Commonly known as:  NORCO/VICODIN  Take 1-2 tablets by mouth every 4 (four) hours as needed for moderate pain.     ibuprofen 200 MG tablet  Commonly known as:  MOTRIN IB  You can take 2-3 tablets every 6 hours, as needed for pain.  Use this first then the narcotic pain medicine as second choice.           Follow-up Information    Follow up with [facility] On 11/30/2014.   Specialty:  General Surgery   Why:  Your appointment is at 11:30 AM, be at the office 30 minutes early for check in.   Contact information:   [address] [place] [id number] [phone]       Signed: Sherrie GeorgeJENNINGS,WILLARD 11/18/2014, 4:57 PM   Agree with above.  Ovidio Kinavid Mikisha Roseland, MD, [facility] Pager: [phone] Office phone:  [phone]

## 2014-11-18 NOTE — Progress Notes (Signed)
Pt able to tolerate regular diet., void ambulate and pain controlled with oral medicine.  Discharge instructions discussed with patient and wife until all questions answered.

## 2014-11-18 NOTE — Anesthesia Preprocedure Evaluation (Addendum)
Anesthesia Evaluation  Patient identified by MRN, date of birth, ID band Patient awake    Reviewed: Allergy & Precautions, NPO status , Patient's Chart, lab work & pertinent test results, reviewed documented beta blocker date and time   Airway Mallampati: II   Neck ROM: Full    Dental  (+) Dental Advisory Given, Teeth Intact   Pulmonary neg pulmonary ROS,    breath sounds clear to auscultation       Cardiovascular negative cardio ROS   Rhythm:Regular     Neuro/Psych negative neurological ROS     GI/Hepatic negative GI ROS, Neg liver ROS,   Endo/Other  negative endocrine ROS  Renal/GU Renal diseasenegative Renal ROS  negative genitourinary   Musculoskeletal negative musculoskeletal ROS (+)   Abdominal (+)  Abdomen: soft.    Peds  Hematology negative hematology ROS (+)   Anesthesia Other Findings   Reproductive/Obstetrics                            Anesthesia Physical Anesthesia Plan  ASA: I and emergent  Anesthesia Plan: General   Post-op Pain Management:    Induction:   Airway Management Planned: Oral ETT  Additional Equipment:   Intra-op Plan:   Post-operative Plan: Extubation in OR  Informed Consent: I have reviewed the patients History and Physical, chart, labs and discussed the procedure including the risks, benefits and alternatives for the proposed anesthesia with the patient or authorized representative who has indicated his/her understanding and acceptance.     Plan Discussed with:   Anesthesia Plan Comments:         Anesthesia Quick Evaluation

## 2014-11-18 NOTE — Transfer of Care (Signed)
Immediate Anesthesia Transfer of Care Note  Patient: Brandon HaradaJosiah Houston  Procedure(s) Performed: Procedure(s): APPENDECTOMY LAPAROSCOPIC (N/A)  Patient Location: PACU  Anesthesia Type:General  Level of Consciousness:  sedated, patient cooperative and responds to stimulation  Airway & Oxygen Therapy:Patient Spontanous Breathing and Patient connected to face mask oxgen  Post-op Assessment:  Report given to PACU RN and Post -op Vital signs reviewed and stable  Post vital signs:  Reviewed and stable  Last Vitals:  Filed Vitals:   11/18/14 0904  BP: 140/82  Pulse:   Temp:   Resp:     Complications: No apparent anesthesia complications

## 2014-11-18 NOTE — Anesthesia Postprocedure Evaluation (Signed)
  Anesthesia Post-op Note  Patient: Brandon Houston  Procedure(s) Performed: Procedure(s): APPENDECTOMY LAPAROSCOPIC (N/A)  Patient Location: PACU  Anesthesia Type:General  Level of Consciousness: awake, alert  and oriented  Airway and Oxygen Therapy: Patient Spontanous Breathing and Patient connected to nasal cannula oxygen  Post-op Pain: mild  Post-op Assessment: Post-op Vital signs reviewed, Patient's Cardiovascular Status Stable, Respiratory Function Stable, RESPIRATORY FUNCTION UNSTABLE and NAUSEA AND VOMITING PRESENT              Post-op Vital Signs: Reviewed and stable  Last Vitals:  Filed Vitals:   11/18/14 0904  BP: 140/82  Pulse: 60  Temp: 36.5 C  Resp: 19    Complications: No apparent anesthesia complications

## 2016-03-06 IMAGING — CT CT ABD-PELV W/ CM
1 of 2 series · 15 of 32 positions shown, 19 images · IV contrast (APPLIED)
Comparison: None.

CLINICAL DATA: Right lower quadrant pain for 5 days, initial
encounter

EXAM:
CT ABDOMEN AND PELVIS WITH CONTRAST
TECHNIQUE: Multidetector CT imaging of the abdomen and pelvis was performed
using the standard protocol following bolus administration of
intravenous contrast.
CONTRAST:  25mL OMNIPAQUE IOHEXOL 300 MG/ML SOLN, 100mL OMNIPAQUE
IOHEXOL 300 MG/ML SOLN

[Series 2: abd/pelvis 5.0 b31f · axial · 0.83mm/px · z∈[-362,+78]mm · 15 of 98 slices shown, 19 images]
[im 5/98  soft-tissue]
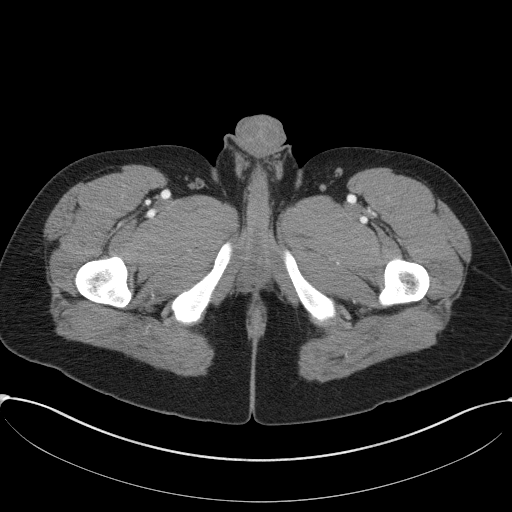
[im 5/98  bone]
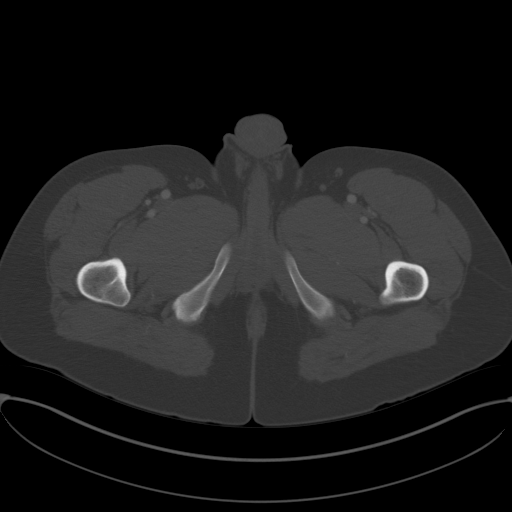
[im 13/98  soft-tissue]
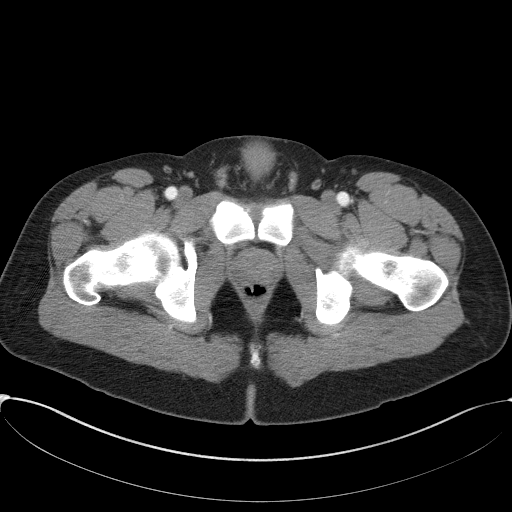
[im 21/98  soft-tissue]
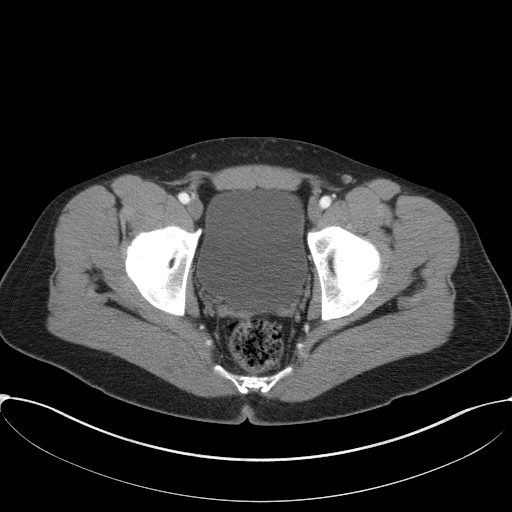
[im 29/98  soft-tissue]
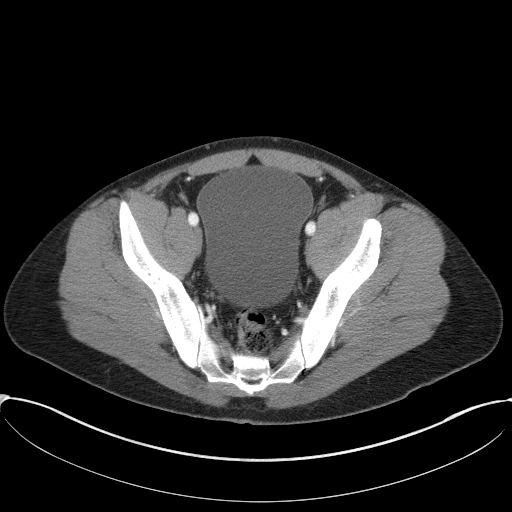
[im 33/98  soft-tissue]
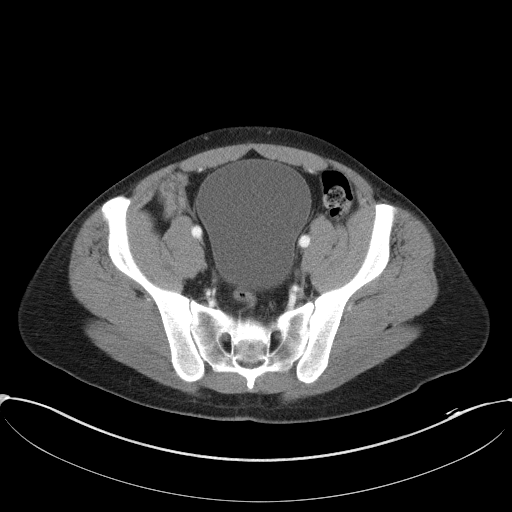
[im 41/98  soft-tissue]
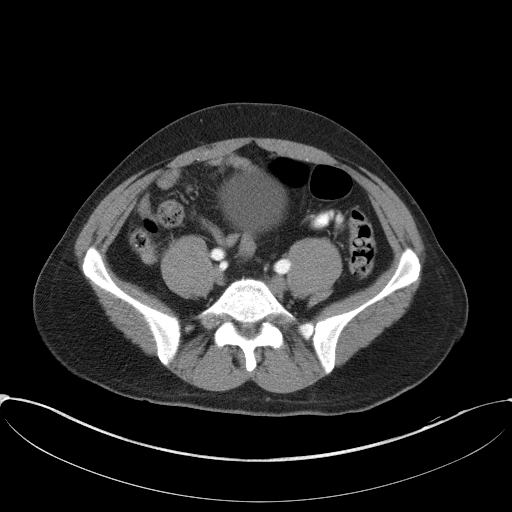
[im 49/98  soft-tissue]
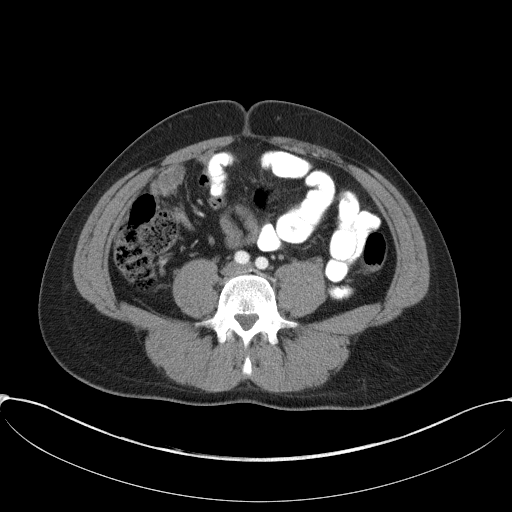
[im 57/98  soft-tissue]
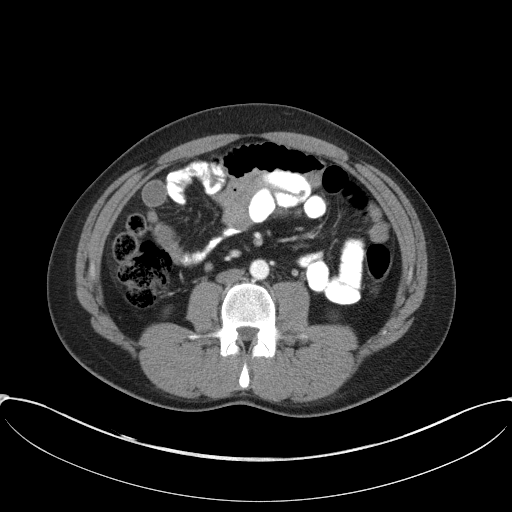
[im 65/98  soft-tissue]
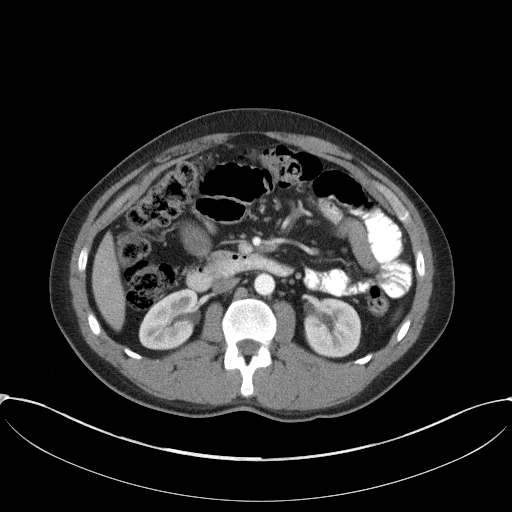
[im 65/98  bone]
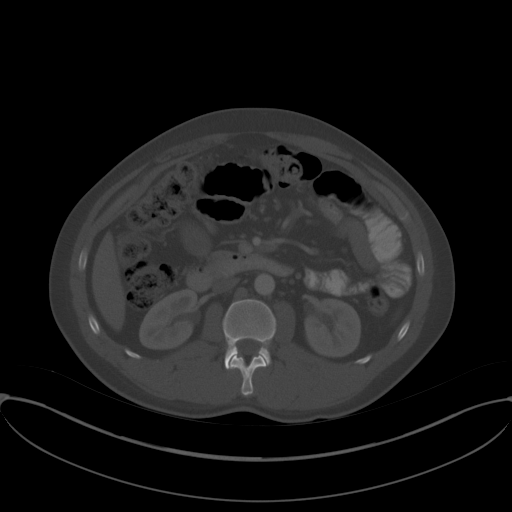
[im 69/98  soft-tissue]
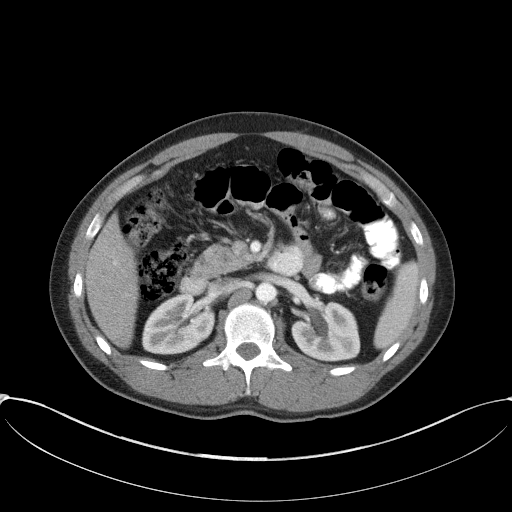
[im 77/98  soft-tissue]
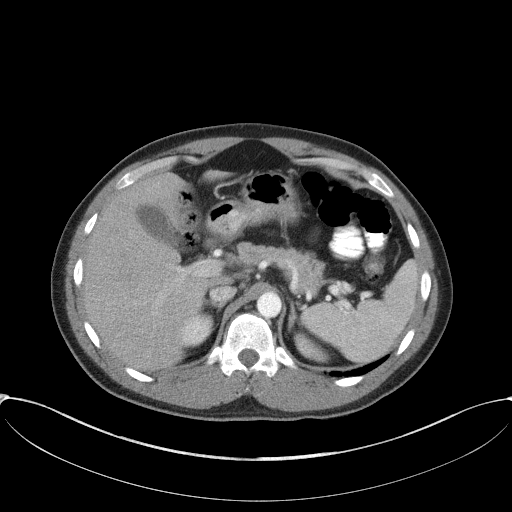
[im 81/98  lung]
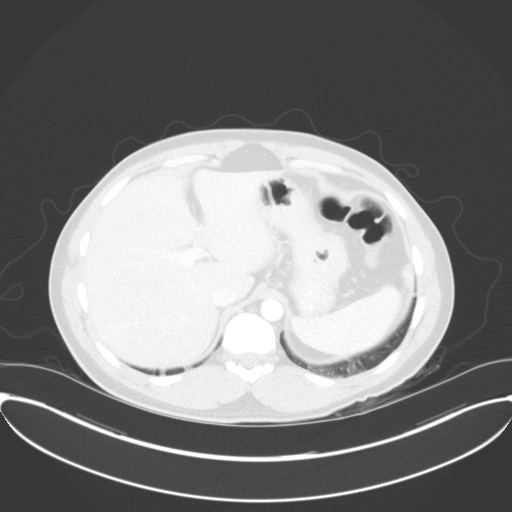
[im 85/98  soft-tissue]
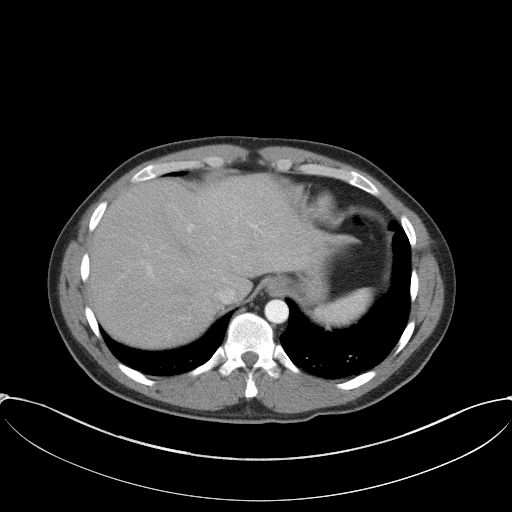
[im 85/98  lung]
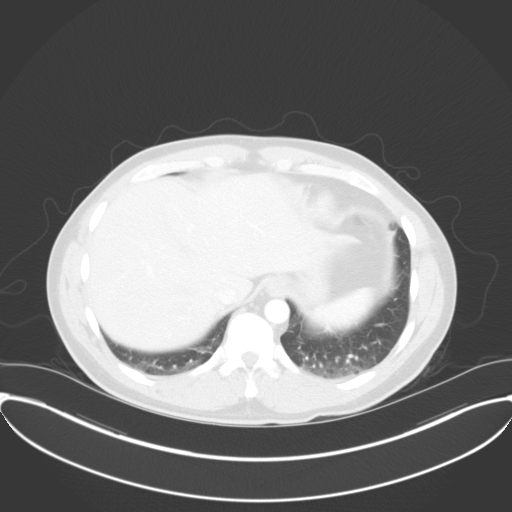
[im 89/98  lung]
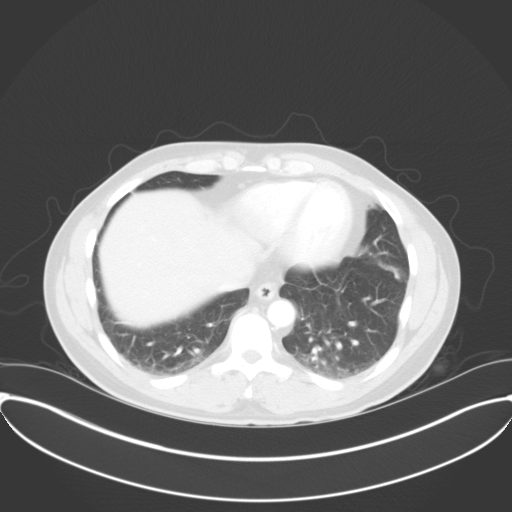
[im 93/98  soft-tissue]
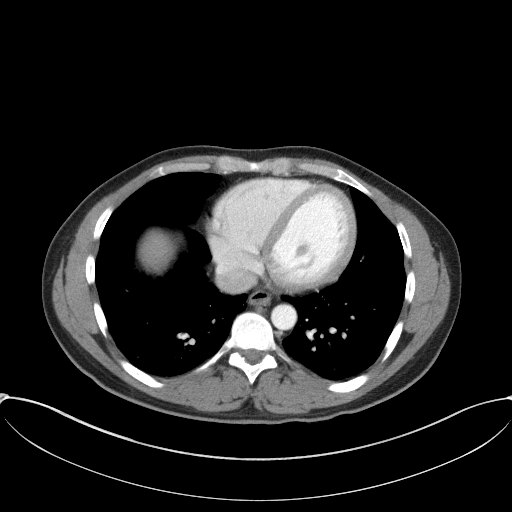
[im 93/98  lung]
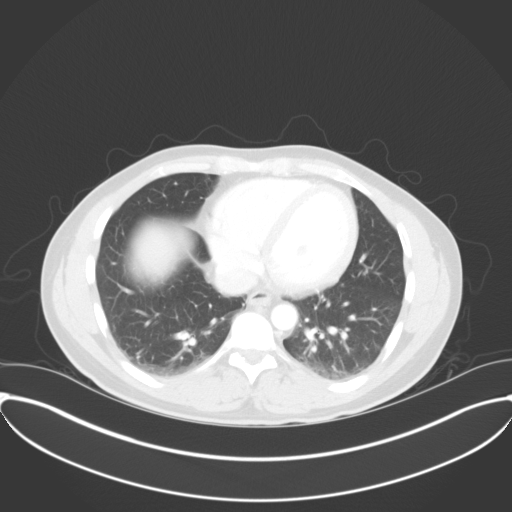

[15 of 32 positions shown; findings below may reference images not displayed]

FINDINGS: Lung bases are free of acute infiltrate or sizable effusion. No
parenchymal nodules are seen. Very minimal basilar atelectasis is
noted.

The liver is diffusely decreased in attenuation consistent with
fatty infiltration. The gallbladder, spleen, adrenal glands and
pancreas are all normal in their CT appearance. Kidneys are well
visualized bilaterally without renal calculi or urinary tract
obstructive changes.

The abdominal aorta and IVC are within normal limits. The bladder is
well distended. No pelvic mass lesion or sidewall abnormality is
seen. The appendix is well visualized and measures 9.2 mm in
greatest dimension. Some very minimal periappendiceal inflammatory
changes are noted. A few through it filled loops of small bowel are
noted in the right mid abdomen although no true obstructive changes
are seen. This may be related some localized enteritis. No bony
abnormality is seen.
IMPRESSION: Mildly prominent appendix with minimal periappendiceal inflammatory
changes. Would be difficult to exclude very early appendicitis
although the patient's white blood cell count is normal.

Few loops of fluid filled mildly prominent small bowel in the right
mid abdomen without true obstructive change. This may represent some
mild enteritis. Clinical correlation with physical exam is
recommended.

## 2020-04-20 ENCOUNTER — Ambulatory Visit: Admit: 2020-04-20 | Primary: Physician Assistant

## 2020-04-20 ENCOUNTER — Ambulatory Visit: Primary: Physician Assistant

## 2020-04-20 DIAGNOSIS — Z0289 Encounter for other administrative examinations: Secondary | ICD-10-CM

## 2020-04-20 NOTE — Progress Notes (Signed)
PROGRESS NOTE    SUBJECTIVE:   Philip Austin is a 30 y.o. male seen for Employment Physical for annual employer Health Risk Assessment and biometric screening as required to obtain their insurance premium discounts.  No complaints or concerns at this time.      OBJECTIVE:  Visit Vitals  BP 138/82 (BP 1 Location: Left upper arm, BP Patient Position: Sitting, BP Cuff Size: Adult)   Pulse 78   Resp 16   Ht 6' 0.5" (1.842 m)   Wt 222 lb 6.4 oz (100.9 kg)   SpO2 98%   BMI 29.75 kg/m??        Physical Exam  Constitutional:       Appearance: He is well-developed.      Comments: Successful venipuncture of pts LAC for specimen collection, pt tolerated procedure well   Neurological:      Mental Status: He is alert.         ASSESSMENT and PLAN    Diagnoses and all orders for this visit:    1. Other general medical examination for administrative purposes  -     COLLECTION VENOUS BLOOD,VENIPUNCTURE  -     LIPID PANEL AND CHOL/HDL RATIO  -     HEMOGLOBIN A1C WITH EAG  -     GLUCOSE, RANDOM    Discussed HR notification process for credits and that it can take 2-3 pay cycles before the discounts are applied.     Will contact with results when available    Counseled on benefits of having a primary care provider which includes, but is not limited to, continuity of care and having a medical home when concerns arise. Also enforced that onsite clinic policy states that we are not to take the place of a primary care provider, pt verbalized understanding.     I have reviewed the patient's medication list, past medical, family, social, and surgical history in detail and updated the patient record appropriately.    Serita Butcher, NP

## 2020-04-21 LAB — HEMOGLOBIN A1C W/EAG
Hemoglobin A1C: 5.6 % (ref 4.8–5.6)
eAG: 114 mg/dL

## 2020-04-21 LAB — LIPID PANEL AND CHOL/HDL RATIO
Cholesterol, Total: 188 mg/dL (ref 100–199)
Cholesterol, total: 188 mg/dL (ref 100–199)
HDL Cholesterol: 32 mg/dL — ABNORMAL LOW (ref >39)
HDL: 32 mg/dL — ABNORMAL LOW (ref >39)
LDL Calculated: 125 mg/dL — ABNORMAL HIGH (ref 0–99)
LDL, calculated: 125 mg/dL — ABNORMAL HIGH (ref 0–99)
T. CHOL/HDL RATIO, 100065: 5.9 ratio — ABNORMAL HIGH (ref 0.0–5.0)
T. Chol/HDL Ratio: 5.9 ratio — ABNORMAL HIGH (ref 0.0–5.0)
Triglyceride: 172 mg/dL — ABNORMAL HIGH (ref 0–149)
Triglycerides: 172 mg/dL — ABNORMAL HIGH (ref 0–149)
VLDL, calculated: 31 mg/dL (ref 5–40)
VLDL: 31 mg/dL (ref 5–40)

## 2020-04-21 LAB — GLUCOSE, RANDOM
Glucose: 80 mg/dL (ref 65–99)
Glucose: 80 mg/dL (ref 65–99)

## 2020-04-21 LAB — HEMOGLOBIN A1C WITH EAG
Estimated average glucose: 114 mg/dL
Hemoglobin A1c: 5.6 % (ref 4.8–5.6)

## 2020-09-20 ENCOUNTER — Ambulatory Visit: Admit: 2020-09-20 | Attending: Family | Primary: Physician Assistant

## 2020-09-20 DIAGNOSIS — Z20822 Contact with and (suspected) exposure to covid-19: Secondary | ICD-10-CM

## 2020-09-20 LAB — AMB POC SARS-COV-2: SARS-COV-2, POC: NOT DETECTED

## 2020-09-20 NOTE — Progress Notes (Signed)
Rapid COVID testing performed today as requested by patient's employer. HR notification of result is necessary for medical surveillance during a pandemic state  in order to complete proper disinfection techniques and contact tracing processes if results are positive, and if results are negative they should remain vigilant in monitoring for symptom development as additional testing may be warranted. Patient consented to testing today (results below) and COVID educational handout provided. RTC as needed for complaints or concerns that may arise.     Results for orders placed or performed in visit on 09/20/20   AMB POC SARS-COV-2   Result Value Ref Range    SARS-COV-2, POC Not-Detected Not Detected       Alastor Kneale A Antwyne Pingree, APRN - CNP

## 2020-10-05 ENCOUNTER — Ambulatory Visit: Admit: 2020-10-05 | Attending: Family | Primary: Physician Assistant

## 2020-10-05 DIAGNOSIS — H66001 Acute suppurative otitis media without spontaneous rupture of ear drum, right ear: Secondary | ICD-10-CM

## 2020-10-05 MED ORDER — AMOXICILLIN-POT CLAVULANATE 875-125 MG PO TABS
875-125 MG | ORAL_TABLET | Freq: Two times a day (BID) | ORAL | 0 refills | Status: AC
Start: 2020-10-05 — End: 2020-10-12

## 2020-10-05 NOTE — Progress Notes (Signed)
PROGRESS NOTE    SUBJECTIVE:   Philip Austin is a 30 y.o. male seen for a 2 week complaint of plugged ear sensation and pain to the right ear that radiates behind the right ear.    Chief Complaint    Ear Fullness         Otalgia   There is pain in the right ear. The current episode started 1 to 4 weeks ago. The problem has been gradually worsening. There has been no fever. The pain is moderate. Associated symptoms include ear discharge and rhinorrhea. Pertinent negatives include no abdominal pain, coughing, diarrhea, headaches, hearing loss, neck pain, rash, sore throat or vomiting. Associated symptoms comments: Muffled hearing; plugged ear sensation; increased wax production. He has tried NSAIDs for the symptoms. The treatment provided mild relief.     Current Outpatient Medications   Medication Sig Dispense Refill   ??? Omega-3 Fatty Acids (OMEGA 3 PO) Take by mouth     ??? Glucosamine-Chondroitin (GLUCOSAMINE CHONDR COMPLEX PO) Take by mouth     ??? amoxicillin-clavulanate (AUGMENTIN) 875-125 MG per tablet Take 1 tablet by mouth 2 times daily for 7 days 14 tablet 0   ??? Cholecalciferol (VITAMIN D3 PO) Take 4,000 Int'l Units by mouth daily     ??? buPROPion (WELLBUTRIN XL) 150 MG extended release tablet TAKE 1 TABLET BY MOUTH EVERY DAY     ??? escitalopram (LEXAPRO) 10 MG tablet TAKE 1 TABLET (10 MG) BY MOUTH DAILY.       No current facility-administered medications for this visit.      Allergies   Allergen Reactions   ??? Buprenorphine Hcl Anaphylaxis and Hives   ??? Morphine Anaphylaxis   ??? Morphine And Related Anaphylaxis and Hives       Social History     Tobacco Use   ??? Smoking status: Never   ??? Smokeless tobacco: Never   Substance Use Topics   ??? Alcohol use: Yes     Alcohol/week: 1.0 standard drink        Review of Systems   Constitutional:  Negative for chills, fatigue and fever.   HENT:  Positive for ear discharge, ear pain, rhinorrhea, sinus pressure and sneezing. Negative for congestion, hearing loss, postnasal drip,  sinus pain and sore throat.    Eyes:  Negative for pain, discharge, redness and itching.   Respiratory:  Negative for cough.    Gastrointestinal:  Negative for abdominal pain, diarrhea and vomiting.   Musculoskeletal:  Negative for myalgias and neck pain.   Skin:  Negative for rash.   Allergic/Immunologic: Positive for environmental allergies.   Neurological:  Negative for dizziness, light-headedness and headaches.        OBJECTIVE:  BP (!) 136/100 (Site: Left Upper Arm, Position: Sitting, Cuff Size: Medium Adult)    Pulse 94    Temp 98 ??F (36.7 ??C) (Oral)    Resp 16    SpO2 99%      No results found for this visit on 10/05/20.    Physical Exam  Vitals reviewed.   Constitutional:       General: He is awake. He is not in acute distress.     Appearance: Normal appearance. He is well-developed and well-groomed.   HENT:      Head: Normocephalic.      Right Ear: Hearing, ear canal and external ear normal. No drainage, swelling or tenderness. A middle ear effusion is present. There is impacted cerumen. Tympanic membrane is bulging.  Left Ear: Hearing, ear canal and external ear normal. No drainage, swelling or tenderness. A middle ear effusion is present. There is impacted cerumen.      Ears:      Comments: TTP over the right mastoid process; earwash performed to removed cerumen impaction bilaterally, pt tolerated procedure well     Nose: Mucosal edema and rhinorrhea present. Rhinorrhea is clear.      Right Turbinates: Swollen and pale.      Left Turbinates: Swollen and pale.      Right Sinus: No maxillary sinus tenderness or frontal sinus tenderness.      Left Sinus: No maxillary sinus tenderness or frontal sinus tenderness.      Mouth/Throat:      Mouth: Mucous membranes are moist.      Pharynx: Oropharynx is clear. Uvula midline. No pharyngeal swelling or posterior oropharyngeal erythema.      Tonsils: No tonsillar exudate.      Comments: PND with cobblestoning  Eyes:      General: Lids are normal. No allergic  shiner.     Conjunctiva/sclera: Conjunctivae normal.      Right eye: Right conjunctiva is not injected. No chemosis.     Left eye: Left conjunctiva is not injected. No chemosis.  Neck:      Thyroid: No thyromegaly.      Trachea: Trachea normal.   Cardiovascular:      Rate and Rhythm: Normal rate and regular rhythm.      Heart sounds: Normal heart sounds.   Pulmonary:      Effort: Pulmonary effort is normal.      Breath sounds: Normal breath sounds.   Musculoskeletal:      Cervical back: Normal range of motion and neck supple.   Lymphadenopathy:      Head:      Right side of head: Posterior auricular adenopathy present. No submental, submandibular, tonsillar, preauricular or occipital adenopathy.      Left side of head: No submental, submandibular, tonsillar, preauricular, posterior auricular or occipital adenopathy.   Skin:     General: Skin is warm and dry.   Neurological:      Mental Status: He is alert and oriented to person, place, and time.   Psychiatric:         Behavior: Behavior is cooperative.     ASSESSMENT and PLAN    Philip Austin was seen today for ear fullness.    Diagnoses and all orders for this visit:    Acute suppurative otitis media of right ear without spontaneous rupture of tympanic membrane, recurrence not specified  Comments:  Augmentin as prescribed with food to cover an ear infection and out of precaution for mastoiditis due to tenderness over the mastoid process. RTC as needed  Orders:  -     amoxicillin-clavulanate (AUGMENTIN) 875-125 MG per tablet; Take 1 tablet by mouth 2 times daily for 7 days    Bilateral impacted cerumen  Comments:  Discussed avoiding placing anything into the ear which can cause wax to become compacted and should use OTC earwax drops to help prevent as well  Orders:  -     16109 - PR REMOVE IMPACTED EAR WAX      Counseled on benefits of having a primary care provider which includes, but is not limited to, continuity of care and having a medical home when concerns arise.  Also enforced that onsite clinic policy states that we are not to take the place of a primary care provider,  pt verbalized understanding.     SEs and risk vs benefits associated with medications prescribed discussed with patient who verbalized understanding. Pt verbalized understanding and agreement with plan of care. RTC for persisting/worsening symptoms or new complaints that arise. Discussed signs and symptoms that would warrant immediate evaluation including, but not limited to HA, blurred vision, speech disturbance, difficulty with ambulation/gait, numbness, tingling, weakness, syncope, chest pain, or shortness of breath.    I have reviewed the patient's medication list, past medical, family, social, and surgical history in detail and updated the patient record appropriately.    Tanda Rockers, APRN - CNP

## 2020-12-12 ENCOUNTER — Ambulatory Visit: Admit: 2020-12-12 | Attending: Family | Primary: Physician Assistant

## 2020-12-12 DIAGNOSIS — Z712 Person consulting for explanation of examination or test findings: Secondary | ICD-10-CM

## 2020-12-12 NOTE — Progress Notes (Signed)
PROGRESS NOTE    SUBJECTIVE:   Philip Austin is a 30 y.o. male seen for results of recent employer Health Risk Assessment. Previous results will also be reviewed to show how values have been trending. Established patient with [facility]. Has a strong family history on his father's side of high cholesterol and "heart issues".    Chief Complaint    Results            Current Outpatient Medications   Medication Sig Dispense Refill    ketoconazole (NIZORAL) 2 % cream APPLY TO AFFECTED AREA ON CHEST/BACK FOR RED IRRITATION 2X DAILY X2 WEEKS THEN AS NEEDED FOR FLARES      Cholecalciferol (VITAMIN D3 PO) Take 4,000 Int'l Units by mouth daily      buPROPion (WELLBUTRIN XL) 150 MG extended release tablet TAKE 1 TABLET BY MOUTH EVERY DAY      escitalopram (LEXAPRO) 10 MG tablet TAKE 1 TABLET (10 MG) BY MOUTH DAILY.      Omega-3 Fatty Acids (OMEGA 3 PO) Take by mouth      Glucosamine-Chondroitin (GLUCOSAMINE CHONDR COMPLEX PO) Take by mouth       No current facility-administered medications for this visit.      Allergies   Allergen Reactions    Buprenorphine Hcl Anaphylaxis and Hives    Morphine Anaphylaxis    Morphine And Related Anaphylaxis and Hives       Social History     Tobacco Use    Smoking status: Never    Smokeless tobacco: Never   Substance Use Topics    Alcohol use: Yes     Alcohol/week: 1.0 standard drink           OBJECTIVE:  There were no vitals taken for this visit.     T. CHOL: 240  TG: 259  HDL: 35  VLDL: 48  LDL: 157  T.CHOL/HDL RATIO: 6.9    GLUCOSE: 89  A1C: 5.3    ASSESSMENT and PLAN    Philip Austin was seen today for results.    Diagnoses and all orders for this visit:    Encounter to discuss test results    Discussed mid-year screening event usually around May if he would like to attempt for the credits he missed for the remainder of the year at that time and what goals he should strive for to meet the credit criteria. Patient desires to return in 3 months to recheck his cholesterol after making  TLCs to see if medication needs to be initiated prior to the mid-year review. Watch salt intake, utilize [employer] products in place of table salt, cook fresh healthy meals with lots of fruits and vegetables, drink at least 8 glass of water a day, and walk daily for 20-30 minutes    Counseled on benefits of having a primary care provider which includes, but is not limited to, continuity of care and having a medical home when concerns arise. Also enforced that onsite clinic policy states that we are not to take the place of a primary care provider, pt verbalized understanding.       Serita Butcher, APRN - CNP

## 2021-03-16 ENCOUNTER — Ambulatory Visit: Admit: 2021-03-16 | Payer: BLUE CROSS/BLUE SHIELD | Attending: Family | Primary: Physician Assistant

## 2021-03-16 DIAGNOSIS — Z008 Encounter for other general examination: Secondary | ICD-10-CM

## 2021-03-16 NOTE — Progress Notes (Signed)
PROGRESS NOTE    SUBJECTIVE:   Philip Austin is a 31 y.o. male seen for quarterly employment physical biometric screening in attempt to gain previously missed insurance premium discounts after making dietary changes (cut out sodas and increased vegetable intake) and starting an exercise regimen.  No complaints or concerns at this time.     Chief Complaint    Employment Physical           OBJECTIVE:  Ht 6\' 2"  (1.88 m)    Wt 230 lb 9.6 oz (104.6 kg)    BMI 29.61 kg/m??      Physical Exam  Vitals reviewed.   Constitutional:       General: He is awake. He is not in acute distress.     Appearance: Normal appearance. He is well-developed and well-groomed.      Comments: Successful venipuncture of the LAC performed, pt tolerated procedure well   Cardiovascular:      Rate and Rhythm: Normal rate and regular rhythm.      Heart sounds: Normal heart sounds.   Pulmonary:      Effort: Pulmonary effort is normal.   Neurological:      Mental Status: He is alert.   Psychiatric:         Behavior: Behavior is cooperative.       ASSESSMENT and PLAN    Philip Austin was seen today for employment physical.    Diagnoses and all orders for this visit:    Encounter for biometric screening  -     Lipid Panel  -     COLLECTION VENOUS BLOOD,VENIPUNCTURE      Discussed HR notification process for credits and that I will contact with results when available    Counseled on benefits of having a primary care provider which includes, but is not limited to, continuity of care and having a medical home when concerns arise. Also enforced that onsite clinic policy states that we are not to take the place of a primary care provider, pt verbalized understanding.     I have reviewed the patient's medication list, past medical, family, social, and surgical history in detail and updated the patient record appropriately.    Jackelyn Hoehn, APRN - CNP

## 2021-03-17 LAB — LIPID PANEL W/ CHOL/HDL RATIO
Chol/HDL Ratio: 6.2 ratio — ABNORMAL HIGH (ref 0.0–5.0)
Cholesterol: 192 mg/dL (ref 100–199)
HDL: 31 mg/dL — ABNORMAL LOW (ref >39)
LDL Calculated: 129 mg/dL — ABNORMAL HIGH (ref 0–99)
Triglycerides: 178 mg/dL — ABNORMAL HIGH (ref 0–149)
VLDL Cholesterol Calculated: 32 mg/dL (ref 5–40)

## 2021-11-29 LAB — LIPID PANEL W/ CHOL/HDL RATIO
Chol/HDL Ratio: 6.5 ratio — ABNORMAL HIGH (ref 0.0–5.0)
Cholesterol: 214 mg/dL — ABNORMAL HIGH (ref 100–199)
HDL: 33 mg/dL — ABNORMAL LOW (ref >39)
LDL Calculated: 144 mg/dL — ABNORMAL HIGH (ref 0–99)
Triglycerides: 205 mg/dL — ABNORMAL HIGH (ref 0–149)
VLDL Cholesterol Calculated: 37 mg/dL (ref 5–40)

## 2021-11-29 LAB — HEMOGLOBIN A1C: Hemoglobin A1C: 5.6 % (ref 4.8–5.6)

## 2021-11-29 LAB — GLUCOSE, RANDOM: Glucose: 98 mg/dL (ref 70–99)
